# Patient Record
Sex: Male | Born: 1990 | Race: Black or African American | Hispanic: No | Marital: Single | Smoking: Current every day smoker
Health system: Southern US, Community
[De-identification: ages and names within clinical notes are randomized; demographics above are authoritative.]

---
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
## 2011-01-11 LAB — URINALYSIS, ROUTINE W REFLEX MICROSCOPIC
Bilirubin Urine: NEGATIVE
Hgb urine dipstick: NEGATIVE
Ketones, ur: NEGATIVE
Specific Gravity, Urine: 1.025
Urobilinogen, UA: 1
pH: 6.5

## 2013-08-18 ENCOUNTER — Encounter (HOSPITAL_COMMUNITY): Payer: Self-pay | Admitting: Emergency Medicine

## 2013-08-18 ENCOUNTER — Emergency Department (HOSPITAL_COMMUNITY)
Admission: EM | Admit: 2013-08-18 | Discharge: 2013-08-18 | Disposition: A | Discharge status: Home / Self Care | Payer: Self-pay | Attending: Emergency Medicine | Admitting: Emergency Medicine

## 2013-08-18 DIAGNOSIS — Y939 Activity, unspecified: Secondary | ICD-10-CM | POA: Insufficient documentation

## 2013-08-18 DIAGNOSIS — W57XXXA Bitten or stung by nonvenomous insect and other nonvenomous arthropods, initial encounter: Secondary | ICD-10-CM

## 2013-08-18 DIAGNOSIS — S90869A Insect bite (nonvenomous), unspecified foot, initial encounter: Principal | ICD-10-CM

## 2013-08-18 DIAGNOSIS — L089 Local infection of the skin and subcutaneous tissue, unspecified: Secondary | ICD-10-CM | POA: Insufficient documentation

## 2013-08-18 DIAGNOSIS — Y929 Unspecified place or not applicable: Secondary | ICD-10-CM | POA: Insufficient documentation

## 2013-08-18 DIAGNOSIS — F17200 Nicotine dependence, unspecified, uncomplicated: Secondary | ICD-10-CM | POA: Insufficient documentation

## 2013-08-18 MED ORDER — SULFAMETHOXAZOLE-TRIMETHOPRIM 800-160 MG PO TABS: 1.0000 | ORAL_TABLET | Freq: Two times a day (BID) | ORAL | Status: AC

## 2013-08-18 NOTE — ED Provider Notes (Signed)
CSN: [id number]     Arrival date & time 08/18/13  1054 History   First MD Initiated Contact with Patient 08/18/13 1123     Chief Complaint  Patient presents with  . Wound Check     (Consider location/radiation/quality/duration/timing/severity/associated sxs/prior Treatment) HPI Glenn Zamora is a 23 y.o. male who presents to the ED with a wound to the right lower leg. He first noted the area yesterday. He complains of redness and some tenderness. He denies fever or chills, nausea or vomiting or any other problems.   History reviewed. No pertinent past medical history. History reviewed. No pertinent past surgical history. No family history on file. History  Substance Use Topics  . Smoking status: Current Every Day Smoker    Types: Cigarettes  . Smokeless tobacco: Not on file  . Alcohol Use: Yes    Review of Systems Negative except as stated in HPI   Allergies  Review of patient's allergies indicates no known allergies.  Home Medications   Prior to Admission medications   Not on File   BP 129/72  Pulse 71  Temp(Src) 97.7 F (36.5 C) (Oral)  Resp 16  Ht 5\' 11"  (1.803 m)  Wt 220 lb (99.791 kg)  BMI 30.70 kg/m2  SpO2 97% Physical Exam  Nursing note and vitals reviewed. Constitutional: He is oriented to person, place, and time. He appears well-developed and well-nourished.  Eyes: EOM are normal.  Neck: Neck supple.  Pulmonary/Chest: Effort normal.  Abdominal: Soft. There is no tenderness.  Musculoskeletal:  There is a papular area noted to the left calf with mild erythema. There is mild tenderness over the site but no other calf tenderness with deep palpation. Pedal pulse strong, adequate circulation, good touch sensation.   Neurological: He is alert and oriented to person, place, and time. No cranial nerve deficit.  Skin: Skin is warm and dry.    ED Course  Procedures   MDM  23 y.o. male with wound to the right lower leg x 24 hours with redness. Will treat with  antibiotics and he will return as needed for worsening symptoms. He will take advil or tylenol as needed for pain. Discussed with the patient and all questioned fully answered.      Medication List         sulfamethoxazole-trimethoprim 800-160 MG per tablet  Commonly known as:  BACTRIM DS,SEPTRA DS  Take 1 tablet by mouth 2 (two) times daily.           Janne NapoleonHope M Rehaan Viloria, [place]NP 08/19/13 [phone]

## 2013-08-18 NOTE — Discharge Instructions (Signed)
Take ibuprofen in addition to the antibiotics. Return as needed for worsening symptoms.

## 2013-08-18 NOTE — ED Notes (Signed)
H. Neese, NP at bedside 

## 2013-08-18 NOTE — ED Notes (Signed)
Wound to right lower leg first noticed yesterday.

## 2013-08-21 NOTE — ED Provider Notes (Signed)
Medical screening examination/treatment/procedure(s) were performed by non-physician practitioner and as supervising physician I was immediately available for consultation/collaboration.  Adleigh Mcmasters L Zinedine Ellner, MD 08/21/13 0848 

## 2014-02-11 ENCOUNTER — Emergency Department: Payer: Self-pay | Admitting: Emergency Medicine

## 2014-10-04 ENCOUNTER — Emergency Department (HOSPITAL_COMMUNITY): Payer: Self-pay

## 2014-10-04 ENCOUNTER — Emergency Department (HOSPITAL_COMMUNITY)
Admission: EM | Admit: 2014-10-04 | Discharge: 2014-10-04 | Disposition: A | Discharge status: Home / Self Care | Payer: Self-pay | Attending: Emergency Medicine | Admitting: Emergency Medicine

## 2014-10-04 ENCOUNTER — Encounter (HOSPITAL_COMMUNITY): Payer: Self-pay | Admitting: Cardiology

## 2014-10-04 DIAGNOSIS — Y998 Other external cause status: Secondary | ICD-10-CM | POA: Insufficient documentation

## 2014-10-04 DIAGNOSIS — W231XXA Caught, crushed, jammed, or pinched between stationary objects, initial encounter: Secondary | ICD-10-CM | POA: Insufficient documentation

## 2014-10-04 DIAGNOSIS — Z720 Tobacco use: Secondary | ICD-10-CM | POA: Insufficient documentation

## 2014-10-04 DIAGNOSIS — Y92008 Other place in unspecified non-institutional (private) residence as the place of occurrence of the external cause: Secondary | ICD-10-CM | POA: Insufficient documentation

## 2014-10-04 DIAGNOSIS — Y9389 Activity, other specified: Secondary | ICD-10-CM | POA: Insufficient documentation

## 2014-10-04 DIAGNOSIS — S62634A Displaced fracture of distal phalanx of right ring finger, initial encounter for closed fracture: Secondary | ICD-10-CM | POA: Insufficient documentation

## 2014-10-04 DIAGNOSIS — S62609A Fracture of unspecified phalanx of unspecified finger, initial encounter for closed fracture: Secondary | ICD-10-CM

## 2014-10-04 MED ORDER — HYDROCODONE-ACETAMINOPHEN 5-325 MG PO TABS: 1.0000 | ORAL_TABLET | ORAL | Status: DC | PRN

## 2014-10-04 MED ORDER — HYDROCODONE-ACETAMINOPHEN 5-325 MG PO TABS
1.0000 | ORAL_TABLET | Freq: Once | ORAL | Status: AC
  Administered 2014-10-04: 1 via ORAL
  Filled 2014-10-04: qty 1

## 2014-10-04 NOTE — ED Provider Notes (Signed)
CSN: [id number]     Arrival date & time 10/04/14  1044 History   First MD Initiated Contact with Patient 10/04/14 1058     Chief Complaint  Patient presents with  . Finger Injury     (Consider location/radiation/quality/duration/timing/severity/associated sxs/prior Treatment) The history is provided by the patient.   Glenn Zamora is a 24 y.o. male presenting with persistent pain and swelling of his right distal ring finger since accidentally slamming it in a door yesterday.  He describes persistent pain which is not improved despite using ice and elevation.  His had no medications for this injury.  He denies numbness in the fingertip.  He is right-handed, currently not working but works in [employer].  He denies radiation of pain and other injury.     History reviewed. No pertinent past medical history. History reviewed. No pertinent past surgical history. History reviewed. No pertinent family history. History  Substance Use Topics  . Smoking status: Current Every Day Smoker    Types: Cigarettes  . Smokeless tobacco: Not on file  . Alcohol Use: Yes     Comment: ocasional     Review of Systems  Constitutional: Negative for fever.  Musculoskeletal: Positive for joint swelling and arthralgias. Negative for myalgias.  Neurological: Negative for weakness and numbness.      Allergies  Review of patient's allergies indicates no known allergies.  Home Medications   Prior to Admission medications   Medication Sig Start Date End Date Taking? Authorizing Provider  HYDROcodone-acetaminophen (NORCO/VICODIN) 5-325 MG per tablet Take 1 tablet by mouth every 4 (four) hours as needed. 10/04/14   Burgess Amor, PA-C   BP 159/97 mmHg  Pulse 107  Temp(Src) 98.2 F (36.8 C) (Oral)  Resp 18  Ht  (1.676 m)  Wt 200 lb (90.719 kg)  BMI 32.30 kg/m2  SpO2 97% Physical Exam  Constitutional: He appears well-developed and well-nourished.  HENT:  Head: Atraumatic.  Neck: Normal range  of motion.  Cardiovascular:  Pulses equal bilaterally  Musculoskeletal: He exhibits tenderness.       Right hand: He exhibits bony tenderness and swelling. He exhibits normal capillary refill. Normal sensation noted.       Hands: Tender to palpation with edema and bruising of the volar right distal ring finger.  Skin is intact, there is no subungual hematoma.  Distal sensation is intact.  Neurological: He is alert. He has normal strength. He displays normal reflexes. No sensory deficit.  Skin: Skin is warm and dry.  Psychiatric: He has a normal mood and affect.    ED Course  Procedures (including critical care time) Labs Review Labs Reviewed - No data to display  Imaging Review Dg Finger Ring Right  10/04/2014   CLINICAL DATA:  Pain, swelling and bruising.  Finger shut in door.  EXAM: RIGHT RING FINGER 2+V  COMPARISON:  05/11/2004  FINDINGS: Comminuted fracture involving the ring finger distal phalanx at the base. There is intra-articular involvement at the DIP joint. There is dorsal and volar displacement of the fractures. Middle phalanx appears to be intact.  IMPRESSION: Comminuted fracture involving the ring finger distal phalanx at the DIP joint.   Electronically Signed   By: Richarda Overlie M.D.   On: 10/04/2014 11:41     EKG Interpretation None      MDM   Final diagnoses:  Finger fracture, right, closed, initial encounter    Patients labs and/or radiological studies were reviewed and considered during the medical decision making and  disposition process.  Results were also discussed with patient.  Pt was placed in a finger splint, advised ice and elevation, hydrocodone prescribed.  Discussed need for orthopedic evaluation as soon as possible.  He will call Dr. Romeo Apple for an appointment for early next week, hopefully Monday.  Patient is aware that he may need a surgical pinning to this injury site to allow for adequate healing given joint space involvement.    Burgess Amor,  PA-C 10/04/14 1219  Gilda Crease, MD 10/07/14 [phone]

## 2014-10-04 NOTE — ED Notes (Addendum)
Slammed right ring  finger in door yesterday.

## 2014-10-04 NOTE — Discharge Instructions (Signed)
Finger Fracture Fractures of fingers are breaks in the bones of the fingers. There are many types of fractures. There are different ways of treating these fractures. Your health care provider will discuss the best way to treat your fracture. CAUSES Traumatic injury is the main cause of broken fingers. These include:  Injuries while playing sports.  Workplace injuries.  Falls. RISK FACTORS Activities that can increase your risk of finger fractures include:  Sports.  Workplace activities that involve machinery.  A condition called osteoporosis, which can make your bones less dense and cause them to fracture more easily. SIGNS AND SYMPTOMS The main symptoms of a broken finger are pain and swelling within 15 minutes after the injury. Other symptoms include:  Bruising of your finger.  Stiffness of your finger.  Numbness of your finger.  Exposed bones (compound fracture) if the fracture is severe. DIAGNOSIS  The best way to diagnose a broken bone is with X-ray imaging. Additionally, your health care provider will use this X-ray image to evaluate the position of the broken finger bones.  TREATMENT  Finger fractures can be treated with:   Nonreduction--This means the bones are in place. The finger is splinted without changing the positions of the bone pieces. The splint is usually left on for about a week to 10 days. This will depend on your fracture and what your health care provider thinks.  Closed reduction--The bones are put back into position without using surgery. The finger is then splinted.  Open reduction and internal fixation--The fracture site is opened. Then the bone pieces are fixed into place with pins or some type of hardware. This is seldom required. It depends on the severity of the fracture. HOME CARE INSTRUCTIONS   Follow your health care provider's instructions regarding activities, exercises, and physical therapy.  Only take over-the-counter or prescription  medicines for pain, discomfort, or fever as directed by your health care provider. SEEK MEDICAL CARE IF: You have pain or swelling that limits the motion or use of your fingers. SEEK IMMEDIATE MEDICAL CARE IF:  Your finger becomes numb. MAKE SURE YOU:   Understand these instructions.  Will watch your condition.  Will get help right away if you are not doing well or get worse. Document Released: 07/11/2000 Document Revised: 01/17/2013 Document Reviewed: 11/08/2012 [facility] Patient Information 2015 [place], [place]. This information is not intended to replace advice given to you by your health care provider. Make sure you discuss any questions you have with your health care provider.   You may take the hydrocodone prescribed for pain relief.  This will make you drowsy - do not drive within 4 hours of taking this medication.

## 2016-02-18 ENCOUNTER — Encounter (HOSPITAL_COMMUNITY): Payer: Self-pay | Admitting: Emergency Medicine

## 2016-02-18 ENCOUNTER — Emergency Department (HOSPITAL_COMMUNITY)
Admission: EM | Admit: 2016-02-18 | Discharge: 2016-02-18 | Disposition: A | Discharge status: Home / Self Care | Payer: Self-pay | Attending: Emergency Medicine | Admitting: Emergency Medicine

## 2016-02-18 DIAGNOSIS — Y929 Unspecified place or not applicable: Secondary | ICD-10-CM | POA: Insufficient documentation

## 2016-02-18 DIAGNOSIS — S0101XA Laceration without foreign body of scalp, initial encounter: Secondary | ICD-10-CM | POA: Insufficient documentation

## 2016-02-18 DIAGNOSIS — Z23 Encounter for immunization: Secondary | ICD-10-CM | POA: Insufficient documentation

## 2016-02-18 DIAGNOSIS — W01198A Fall on same level from slipping, tripping and stumbling with subsequent striking against other object, initial encounter: Secondary | ICD-10-CM | POA: Insufficient documentation

## 2016-02-18 DIAGNOSIS — Y9389 Activity, other specified: Secondary | ICD-10-CM | POA: Insufficient documentation

## 2016-02-18 DIAGNOSIS — F17210 Nicotine dependence, cigarettes, uncomplicated: Secondary | ICD-10-CM | POA: Insufficient documentation

## 2016-02-18 DIAGNOSIS — Y999 Unspecified external cause status: Secondary | ICD-10-CM | POA: Insufficient documentation

## 2016-02-18 MED ORDER — TETANUS-DIPHTH-ACELL PERTUSSIS 5-2.5-18.5 LF-MCG/0.5 IM SUSP
0.5000 mL | Freq: Once | INTRAMUSCULAR | Status: AC
  Administered 2016-02-18: 0.5 mL via INTRAMUSCULAR
  Filled 2016-02-18: qty 0.5

## 2016-02-18 MED ORDER — BACITRACIN ZINC 500 UNIT/GM EX OINT: 1.0000 "application " | TOPICAL_OINTMENT | Freq: Two times a day (BID) | CUTANEOUS | 1 refills | Status: AC
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
## 2017-01-28 IMAGING — DX DG FINGER RING 2+V*R*
3 series · 3 of 3 positions shown · non-contrast
Comparison: 05/11/2004

CLINICAL DATA: Pain, swelling and bruising.  Finger shut in door.

EXAM:
RIGHT RING FINGER 2+V

[finger ap]
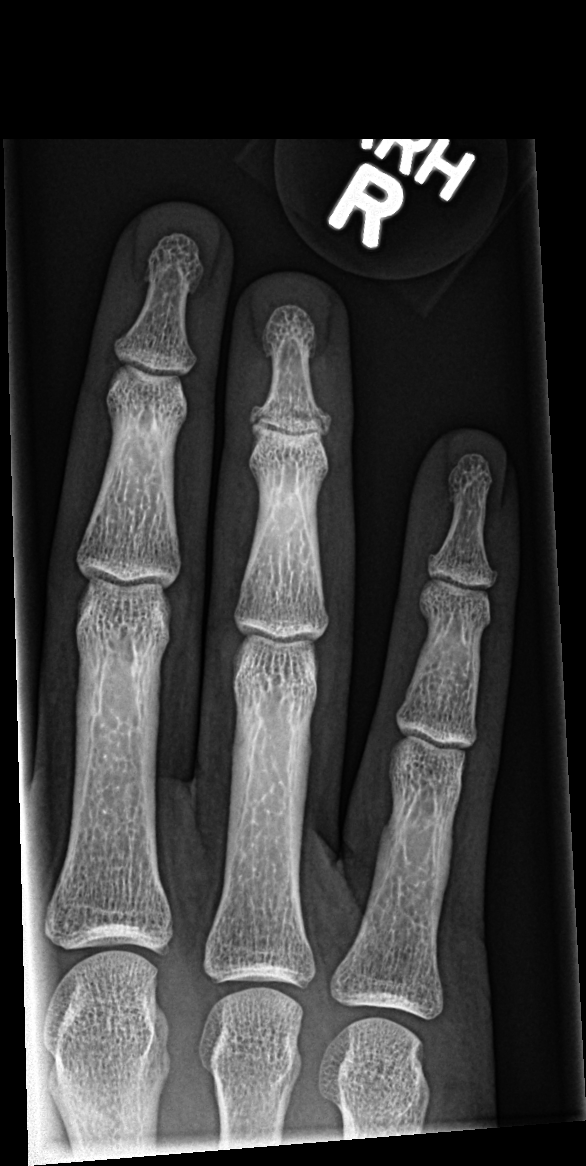

[finger obl]
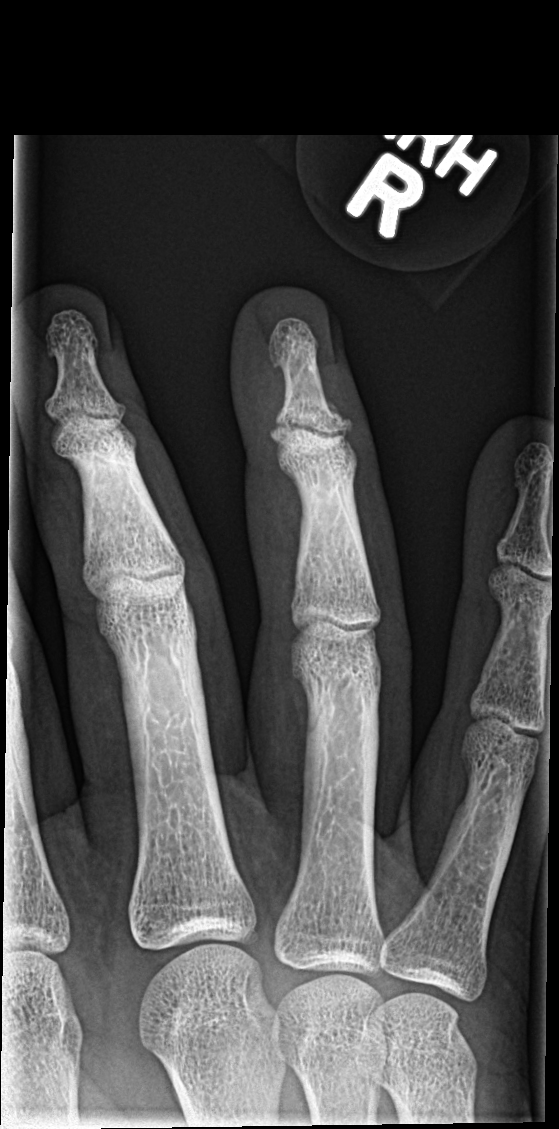

[finger lat]
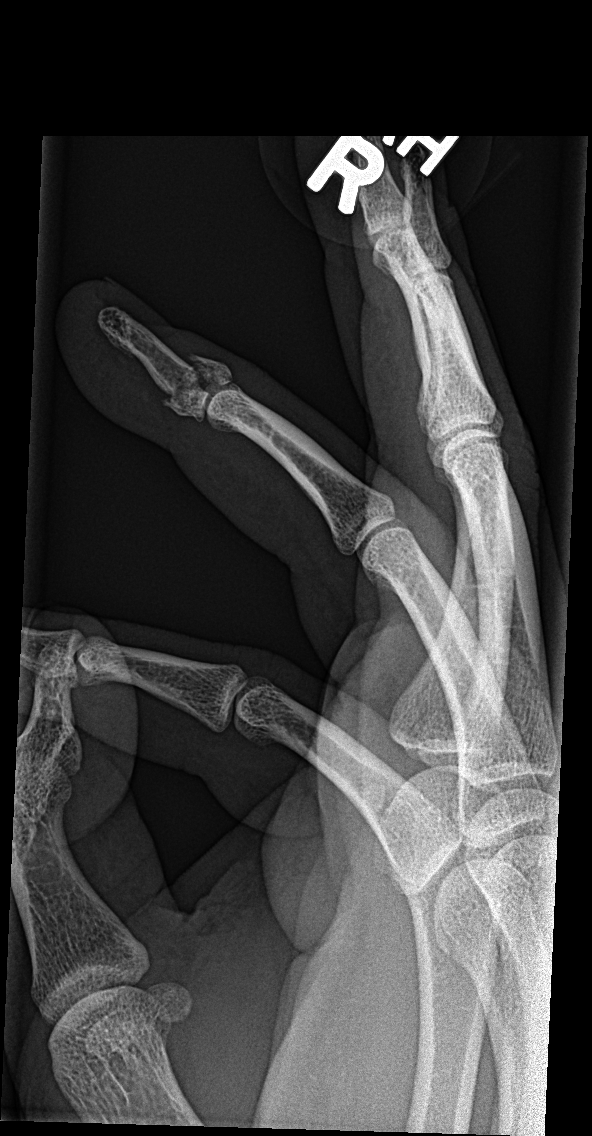

[3 of 3 positions shown; findings below may reference images not displayed]

FINDINGS: Comminuted fracture involving the ring finger distal phalanx at the
base. There is intra-articular involvement at the DIP joint. There
is dorsal and volar displacement of the fractures. Middle phalanx
appears to be intact.
IMPRESSION: Comminuted fracture involving the ring finger distal phalanx at the
DIP joint.

## 2023-01-15 ENCOUNTER — Emergency Department (HOSPITAL_COMMUNITY): Payer: Self-pay

## 2023-01-15 ENCOUNTER — Other Ambulatory Visit: Payer: Self-pay

## 2023-01-15 ENCOUNTER — Encounter (HOSPITAL_COMMUNITY): Payer: Self-pay | Admitting: Emergency Medicine

## 2023-01-15 ENCOUNTER — Emergency Department (HOSPITAL_COMMUNITY)
Admission: EM | Admit: 2023-01-15 | Discharge: 2023-01-15 | Disposition: A | Discharge status: Home / Self Care | Payer: Self-pay | Attending: Emergency Medicine | Admitting: Emergency Medicine

## 2023-01-15 DIAGNOSIS — S60419A Abrasion of unspecified finger, initial encounter: Secondary | ICD-10-CM

## 2023-01-15 DIAGNOSIS — S60414A Abrasion of right ring finger, initial encounter: Secondary | ICD-10-CM | POA: Insufficient documentation

## 2023-01-15 DIAGNOSIS — S0081XA Abrasion of other part of head, initial encounter: Secondary | ICD-10-CM | POA: Insufficient documentation

## 2023-01-15 DIAGNOSIS — Z23 Encounter for immunization: Secondary | ICD-10-CM | POA: Insufficient documentation

## 2023-01-15 DIAGNOSIS — S61216A Laceration without foreign body of right little finger without damage to nail, initial encounter: Secondary | ICD-10-CM | POA: Insufficient documentation

## 2023-01-15 MED ORDER — LIDOCAINE-EPINEPHRINE (PF) 2 %-1:200000 IJ SOLN
INTRAMUSCULAR | Status: AC
  Administered 2023-01-15: 20 mL
  Filled 2023-01-15: qty 20

## 2023-01-15 MED ORDER — CEPHALEXIN 500 MG PO CAPS
500.0000 mg | ORAL_CAPSULE | Freq: Once | ORAL | Status: AC
  Administered 2023-01-15: 500 mg via ORAL
  Filled 2023-01-15: qty 1

## 2023-01-15 MED ORDER — CEPHALEXIN 500 MG PO CAPS: 500.0000 mg | ORAL_CAPSULE | Freq: Four times a day (QID) | ORAL | 0 refills | Status: AC

## 2023-01-15 MED ORDER — HYDROCODONE-ACETAMINOPHEN 5-325 MG PO TABS: ORAL_TABLET | ORAL | 0 refills | Status: AC

## 2023-01-15 MED ORDER — TETANUS-DIPHTH-ACELL PERTUSSIS 5-2.5-18.5 LF-MCG/0.5 IM SUSY
0.5000 mL | PREFILLED_SYRINGE | Freq: Once | INTRAMUSCULAR | Status: AC
  Administered 2023-01-15: 0.5 mL via INTRAMUSCULAR
  Filled 2023-01-15: qty 0.5

## 2023-01-15 MED ORDER — POVIDONE-IODINE 10 % EX SOLN
CUTANEOUS | Status: DC | PRN
  Filled 2023-01-15: qty 29.6

## 2023-01-15 MED ORDER — OXYCODONE-ACETAMINOPHEN 5-325 MG PO TABS
1.0000 | ORAL_TABLET | Freq: Once | ORAL | Status: AC
  Administered 2023-01-15: 1 via ORAL
  Filled 2023-01-15: qty 1

## 2023-01-15 MED ORDER — LIDOCAINE HCL (PF) 2 % IJ SOLN
5.0000 mL | Freq: Once | INTRAMUSCULAR | Status: AC
  Administered 2023-01-15: 5 mL via INTRADERMAL
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
## 2023-01-15 NOTE — Discharge Instructions (Signed)
Keep the wounds clean with mild soap and water.  Keep bandaged.  Sutures out in 8 to 10 days.  Avoid excessive use of your right hand as this may cause excessive swelling.  May also take ibuprofen every 6-8 hours if needed.  Call the orthopedic provider listed to arrange follow-up appointment.  Return to emergency department for any new or worsening symptoms or signs of infection

## 2023-02-07 ENCOUNTER — Encounter (HOSPITAL_COMMUNITY): Payer: Self-pay | Admitting: Emergency Medicine

## 2023-02-07 ENCOUNTER — Other Ambulatory Visit: Payer: Self-pay

## 2023-02-07 ENCOUNTER — Emergency Department (HOSPITAL_COMMUNITY)
Admission: EM | Admit: 2023-02-07 | Discharge: 2023-02-07 | Disposition: A | Discharge status: Home / Self Care | Payer: Self-pay | Attending: Emergency Medicine | Admitting: Emergency Medicine

## 2023-02-07 DIAGNOSIS — Z4802 Encounter for removal of sutures: Secondary | ICD-10-CM | POA: Insufficient documentation

## 2023-02-07 NOTE — ED Provider Notes (Signed)
  [place] EMERGENCY DEPARTMENT AT [facility] Provider Note   CSN: [id number] Arrival date & time: 02/07/23  0002     History  Chief Complaint  Patient presents with   Suture / Staple Removal    Glenn Zamora is a 32 y.o. male.  Patient is a 32 year old male presenting for suture removal.  He had 3 sutures placed in the fifth finger of his right hand approximately 1 week ago.  Patient has no other complaints.  The history is provided by the patient.       Home Medications Prior to Admission medications   Medication Sig Start Date End Date Taking? Authorizing Provider  bacitracin ointment Apply 1 application topically 2 (two) times daily. 02/18/16   Everlene Farrier, PA-C  cephALEXin (KEFLEX) 500 MG capsule Take 1 capsule (500 mg total) by mouth 4 (four) times daily. 01/15/23   Triplett, Tammy, PA-C  HYDROcodone-acetaminophen (NORCO/VICODIN) 5-325 MG tablet Take one tab po q 4 hrs prn pain 01/15/23   Triplett, Tammy, PA-C      Allergies    Patient has no known allergies.    Review of Systems   Review of Systems  All other systems reviewed and are negative.   Physical Exam Updated Vital Signs BP 135/89 (BP Location: Right Arm)   Pulse (!) 107   Temp 98.1 F (36.7 C) (Oral)   Resp 16   Ht 5\' 9"  (1.753 m)   Wt 104 kg   SpO2 98%   BMI 33.86 kg/m  Physical Exam Vitals and nursing note reviewed.  Constitutional:      Appearance: Normal appearance.  Skin:    Comments: The wound to the right fifth finger appears to be healing well.  3 sutures are in place.  No redness, warmth, or purulent drainage.  Neurological:     Mental Status: He is alert and oriented to person, place, and time.     ED Results / Procedures / Treatments   Labs (all labs ordered are listed, but only abnormal results are displayed) Labs Reviewed - No data to display  EKG None  Radiology No results found.  Procedures Procedures    Medications Ordered in ED Medications -  No data to display  ED Course/ Medical Decision Making/ A&P  Sutures removed.  Wound looks good.  Patient to follow-up as needed.  Final Clinical Impression(s) / ED Diagnoses Final diagnoses:  None    Rx / DC Orders ED Discharge Orders     None         Geoffery Lyons, MD 02/07/23 [phone]

## 2023-02-07 NOTE — ED Triage Notes (Signed)
Pt here with 3 sutures intact to Rt pinky finger that he states were supposed to be removed 3 days ago.

## 2023-02-07 NOTE — Discharge Instructions (Signed)
Return to the ER if you experience any new and/or concerning issues.
